# Patient Record
Sex: Male | Born: 2000 | Race: White | Hispanic: No | Marital: Single
Health system: Southern US, Community
[De-identification: ages and names within clinical notes are randomized; demographics above are authoritative.]

## PROBLEM LIST (undated history)

## (undated) DIAGNOSIS — R1310 Dysphagia, unspecified: Secondary | ICD-10-CM

## (undated) DIAGNOSIS — S43491D Other sprain of right shoulder joint, subsequent encounter: Secondary | ICD-10-CM

## (undated) DIAGNOSIS — M24412 Recurrent dislocation, left shoulder: Secondary | ICD-10-CM

## (undated) DIAGNOSIS — R198 Other specified symptoms and signs involving the digestive system and abdomen: Secondary | ICD-10-CM

## (undated) DIAGNOSIS — F909 Attention-deficit hyperactivity disorder, unspecified type: Secondary | ICD-10-CM

## (undated) DIAGNOSIS — S43432A Superior glenoid labrum lesion of left shoulder, initial encounter: Secondary | ICD-10-CM

---
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
## 2019-09-30 ENCOUNTER — Ambulatory Visit (INDEPENDENT_AMBULATORY_CARE_PROVIDER_SITE_OTHER): Payer: 59

## 2019-09-30 ENCOUNTER — Other Ambulatory Visit: Payer: Self-pay

## 2019-09-30 ENCOUNTER — Ambulatory Visit (INDEPENDENT_AMBULATORY_CARE_PROVIDER_SITE_OTHER): Payer: 59 | Admitting: Podiatry

## 2019-09-30 ENCOUNTER — Encounter: Payer: Self-pay | Admitting: *Deleted

## 2019-09-30 DIAGNOSIS — M722 Plantar fascial fibromatosis: Secondary | ICD-10-CM

## 2019-09-30 NOTE — Patient Instructions (Signed)
Superfeet insoles on [employer]

## 2019-10-03 NOTE — Progress Notes (Signed)
   Subjective: 19 y.o. male presenting today as a new patient with a chief complaint of sharp, stabbing pain to the right heel and arch that began 6 months ago. He states the pain is worse when he first gets out of bed. Exercising increases the pain. He has been rolling the foot on a ball and taking Tylenol and Ibuprofen for treatment. Patient is here for further evaluation and treatment.   Past Medical History:  Diagnosis Date  . ADHD (attention deficit hyperactivity disorder)    no current med.  . Bankart lesion, right, subsequent encounter 03/15/2017  . Difficulty swallowing pills   . 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 Superior glenoid labrum lesion of left shoulder 02/2017     Objective: Physical Exam General: The patient is alert and oriented x3 in no acute distress.  Dermatology: Skin is warm, dry and supple bilateral lower extremities. Negative for open lesions or macerations bilateral.   Vascular: Dorsalis Pedis and Posterior Tibial pulses palpable bilateral.  Capillary fill time is immediate to all digits.  Neurological: Epicritic and protective threshold intact bilateral.   Musculoskeletal: Tenderness to palpation to the plantar aspect of the right heel along the plantar fascia. All other joints range of motion within normal limits bilateral. Strength 5/5 in all groups bilateral.   Radiographic exam: Normal osseous mineralization. Joint spaces preserved. No fracture/dislocation/boney destruction. No other soft tissue abnormalities or radiopaque foreign bodies.   Assessment: 1. Plantar fasciitis right  Plan of Care:  1. Patient evaluated. Xrays reviewed.   2. Injection of 0.5cc Celestone soluspan injected into the right plantar fascia  3. Cannot swallow pills.  4. Recommended OTC Superfeet insoles from [employer].  5. Return to clinic as needed.    Felecia Shelling, DPM [facility]  Dr. Felecia Shelling, DPM    [address] [place], [place] [address]                Office [phone]  Fax [phone]
